# Patient Record
Sex: Female | Born: 1960 | Race: Black or African American | Hispanic: No | Marital: Married | Smoking: Never smoker
Health system: Southern US, Community
[De-identification: ages and names within clinical notes are randomized; demographics above are authoritative.]

## PROBLEM LIST (undated history)

## (undated) DIAGNOSIS — Z789 Other specified health status: Secondary | ICD-10-CM

## (undated) DIAGNOSIS — H409 Unspecified glaucoma: Secondary | ICD-10-CM

## (undated) HISTORY — DX: Unspecified glaucoma: H40.9

## (undated) HISTORY — PX: HERNIA REPAIR: SHX51

---

## 1999-08-26 ENCOUNTER — Other Ambulatory Visit: Admission: RE | Admit: 1999-08-26 | Discharge: 1999-08-26 | Payer: Self-pay | Admitting: *Deleted

## 2000-11-17 ENCOUNTER — Other Ambulatory Visit: Admission: RE | Admit: 2000-11-17 | Discharge: 2000-11-17 | Payer: Self-pay | Admitting: *Deleted

## 2002-03-27 ENCOUNTER — Other Ambulatory Visit: Admission: RE | Admit: 2002-03-27 | Discharge: 2002-03-27 | Payer: Self-pay | Admitting: *Deleted

## 2002-09-29 ENCOUNTER — Emergency Department (HOSPITAL_COMMUNITY): Admission: EM | Admit: 2002-09-29 | Discharge: 2002-09-29 | Payer: Self-pay

## 2002-09-30 ENCOUNTER — Encounter: Payer: Self-pay | Admitting: Emergency Medicine

## 2004-01-03 ENCOUNTER — Other Ambulatory Visit: Admission: RE | Admit: 2004-01-03 | Discharge: 2004-01-03 | Payer: Self-pay | Admitting: *Deleted

## 2005-02-11 ENCOUNTER — Other Ambulatory Visit: Admission: RE | Admit: 2005-02-11 | Discharge: 2005-02-11 | Payer: Self-pay | Admitting: Obstetrics and Gynecology

## 2005-12-07 ENCOUNTER — Emergency Department (HOSPITAL_COMMUNITY): Admission: EM | Admit: 2005-12-07 | Discharge: 2005-12-07 | Payer: Self-pay | Admitting: Emergency Medicine

## 2009-03-08 ENCOUNTER — Emergency Department (HOSPITAL_COMMUNITY): Admission: EM | Admit: 2009-03-08 | Discharge: 2009-03-08 | Payer: Self-pay | Admitting: Emergency Medicine

## 2011-04-27 ENCOUNTER — Other Ambulatory Visit: Payer: Self-pay | Admitting: Family Medicine

## 2011-04-27 DIAGNOSIS — L723 Sebaceous cyst: Secondary | ICD-10-CM

## 2011-04-28 ENCOUNTER — Ambulatory Visit
Admission: RE | Admit: 2011-04-28 | Discharge: 2011-04-28 | Disposition: A | Discharge status: Home / Self Care | Payer: BC Managed Care – PPO | Source: Ambulatory Visit | Attending: Family Medicine | Admitting: Family Medicine

## 2011-04-28 DIAGNOSIS — L723 Sebaceous cyst: Secondary | ICD-10-CM

## 2012-03-08 ENCOUNTER — Encounter: Payer: Self-pay | Admitting: Internal Medicine

## 2012-03-25 ENCOUNTER — Telehealth: Payer: Self-pay | Admitting: *Deleted

## 2012-03-25 NOTE — Telephone Encounter (Signed)
No show for previsit.  Unable to reach at home number.  Sent no show letter 

## 2012-04-08 ENCOUNTER — Encounter: Payer: BC Managed Care – PPO | Admitting: Internal Medicine

## 2017-09-12 ENCOUNTER — Encounter (HOSPITAL_COMMUNITY): Payer: Self-pay | Admitting: *Deleted

## 2017-09-12 ENCOUNTER — Ambulatory Visit (HOSPITAL_COMMUNITY)
Admission: EM | Admit: 2017-09-12 | Discharge: 2017-09-12 | Disposition: A | Discharge status: Home / Self Care | Payer: 59 | Attending: Family Medicine | Admitting: Family Medicine

## 2017-09-12 DIAGNOSIS — M5432 Sciatica, left side: Secondary | ICD-10-CM | POA: Diagnosis not present

## 2017-09-12 MED ORDER — CYCLOBENZAPRINE HCL 5 MG PO TABS: 5.0000 mg | ORAL_TABLET | Freq: Every day | ORAL | 0 refills | Status: DC

## 2017-09-12 MED ORDER — PREDNISONE 20 MG PO TABS: ORAL_TABLET | ORAL | 0 refills | Status: DC

## 2017-09-12 NOTE — ED Triage Notes (Signed)
Denies injury.  C/O low back pain radiating down LLE with intermittent numbness/tingling.  Went to another urgent care 4 days ago - was placed on methylprednisolone.  States pain actually getting worse.  Denies any incontinence.

## 2017-09-12 NOTE — ED Provider Notes (Signed)
[facility]   [id number] 09/12/17 Arrival Time: 1212   SUBJECTIVE:  Kristin Nguyen is a 56 y.o. female who presents to the urgent care with complaint of  low back pain radiating down LLE with intermittent numbness/tingling.  Went to another urgent care 4 days ago - was placed on methylprednisolone.  Pain had dramatically improved, but worsened yesterday after doing a great deal of housework.   Denies any incontinence.     History reviewed. No pertinent past medical history. No family history on file. Social History   Socioeconomic History  . Marital status: Married    Spouse name: Not on file  . Number of children: Not on file  . Years of education: Not on file  . Highest education level: Not on file  Social Needs  . Financial resource strain: Not on file  . Food insecurity - worry: Not on file  . Food insecurity - inability: Not on file  . Transportation needs - medical: Not on file  . Transportation needs - non-medical: Not on file  Occupational History  . Not on file  Tobacco Use  . Smoking status: Never Smoker  Substance and Sexual Activity  . Alcohol use: No    Frequency: Never  . Drug use: No  . Sexual activity: Not on file  Other Topics Concern  . Not on file  Social History Narrative  . Not on file   Current Meds  Medication Sig  . Misc Natural Products (SM GLUCOSAMINE CHONDROITIN PO) Take by mouth.  . Multiple Vitamin (MULTIVITAMIN) capsule Take 1 capsule daily by mouth.  . Omega-3 Fatty Acids (FISH OIL PO) Take by mouth.  . [DISCONTINUED] METHYLPREDNISOLONE PO Take by mouth.   No Known Allergies    ROS: As per HPI, remainder of ROS negative.   OBJECTIVE:   Vitals:   09/12/17 1311  BP: 129/79  Pulse: 68  Resp: 16  Temp: 97.6 F (36.4 C)  TempSrc: Oral  SpO2: 98%     General appearance: alert; no distress Eyes: PERRL; EOMI; conjunctiva normal HENT: normocephalic; atraumatic;  external ears normal without trauma; nasal  mucosa normal; oral mucosa normal Neck: supple Abdomen: soft, non-tender; bowel sounds normal; no masses or organomegaly; no guarding or rebound tenderness Back: no CVA tenderness Extremities: no cyanosis or edema; symmetrical with no gross deformities;   SLR positive on left at 20 degrees. Skin: warm and dry Neurologic: normal gait; grossly normal Psychological: alert and cooperative; normal mood and affect      Labs:  No results found for this or any previous visit.  Labs Reviewed - No data to display  No results found.     ASSESSMENT & PLAN:  1. Sciatica of left side     Meds ordered this encounter  Medications  . DISCONTD: METHYLPREDNISOLONE PO    Sig: Take by mouth.  . Omega-3 Fatty Acids (FISH OIL PO)    Sig: Take by mouth.  . Multiple Vitamin (MULTIVITAMIN) capsule    Sig: Take 1 capsule daily by mouth.  . Misc Natural Products (SM GLUCOSAMINE CHONDROITIN PO)    Sig: Take by mouth.  . predniSONE (DELTASONE) 20 MG tablet    Sig: Two daily with food    Dispense:  10 tablet    Refill:  0  . cyclobenzaprine (FLEXERIL) 5 MG tablet    Sig: Take 1 tablet (5 mg total) at bedtime by mouth.    Dispense:  7 tablet    Refill:  0  Reviewed expectations re: course of current medical issues. Questions answered. Outlined signs and symptoms indicating need for more acute intervention. Patient verbalized understanding. After Visit Summary given.    Procedures:      Elvina SidleLauenstein, Elizet Kaplan, MD 09/12/17 1339

## 2017-09-14 ENCOUNTER — Ambulatory Visit (INDEPENDENT_AMBULATORY_CARE_PROVIDER_SITE_OTHER): Payer: 59

## 2017-09-14 ENCOUNTER — Encounter (INDEPENDENT_AMBULATORY_CARE_PROVIDER_SITE_OTHER): Payer: Self-pay | Admitting: Orthopedic Surgery

## 2017-09-14 ENCOUNTER — Ambulatory Visit (INDEPENDENT_AMBULATORY_CARE_PROVIDER_SITE_OTHER): Payer: 59 | Admitting: Orthopedic Surgery

## 2017-09-14 DIAGNOSIS — M5442 Lumbago with sciatica, left side: Secondary | ICD-10-CM | POA: Diagnosis not present

## 2017-09-14 MED ORDER — PREDNISONE 10 MG PO TABS: 20.0000 mg | ORAL_TABLET | Freq: Every day | ORAL | 0 refills | Status: DC

## 2017-09-14 NOTE — Progress Notes (Signed)
Office Visit Note   Patient: Kristin BowelsColleen A Bashor           Date of Birth: 10/21/1961           MRN: [id number] Visit Date: 09/14/2017              Requested by: No referring provider defined for this encounter. PCP: Darrin Nipperollege, [facility] [place]  Chief Complaint  Patient presents with  . Lower Back - Pain      HPI: Patient is a 56 year old woman who has had several month history of lower back pain.  States she had pain about 3 months ago has had no injuries and has had recurrent pain in her lumbar spine with pain radiating to the left buttocks and the lateral aspect of the left calf.  She states it is difficult to sleep she has increased pain after prolonged sleeping.  She started a methyl prednisolone Dosepak of 1 week ago and 2 days ago started a prescription for prednisone 20 mg twice daily as well as Flexeril with minimal relief.  Patient states that she has severe claustrophobia and does not feel like she could tolerate an MRI scan is not in any way.  Assessment & Plan: Visit Diagnoses:  1. Acute left-sided low back pain with left-sided sciatica     Plan: We will have her continue the prednisone recommended taking only 20 mg every morning she can take the Flexeril at night we will call in a new prescription for the prednisone and she is to use a ThermaCare heating pad on her back.  Recommended getting a standing desk avoid sitting.  She will call if she wants to schedule an MRI scan.  Patient states she has severe claustrophobia and does not feel like she could tolerate an MRI scan even with Valium.  Follow-Up Instructions: Return if symptoms worsen or fail to improve.   Ortho Exam  Patient is alert, oriented, no adenopathy, well-dressed, normal affect, normal respiratory effort. Gait with a flexed posture secondary to her muscle spasm.  Patient has no pain with range of motion of the hip knee or ankle.  She has a negative straight leg raise on the left.  She has good  motor strength in all motor groups with the EHL plantar flexion dorsiflexion of the ankle knee flexion extension and hip flexion.  Imaging: Xr Lumbar Spine 2-3 Views  Result Date: 09/14/2017 2 view radiographs of the lumbar spine shows some mild bony spurs there is no significant joint space collapse no spondylolisthesis.  Possible spondylolysis at L5.  No images are attached to the encounter.  Labs: No results found for: HGBA1C, ESRSEDRATE, CRP, LABURIC, REPTSTATUS, GRAMSTAIN, CULT, LABORGA  Orders:  Orders Placed This Encounter  Procedures  . XR Lumbar Spine 2-3 Views   No orders of the defined types were placed in this encounter.    Procedures: No procedures performed  Clinical Data: No additional findings.  ROS:  All other systems negative, except as noted in the HPI. Review of Systems  Objective: Vital Signs: There were no vitals taken for this visit.  Specialty Comments:  No specialty comments available.  PMFS History: There are no active problems to display for this patient.  History reviewed. No pertinent past medical history.  History reviewed. No pertinent family history.  History reviewed. No pertinent surgical history. Social History   Occupational History  . Not on file  Tobacco Use  . Smoking status: Never Smoker  . Smokeless tobacco:  Never Used  Substance and Sexual Activity  . Alcohol use: No    Frequency: Never  . Drug use: No  . Sexual activity: Not on file

## 2017-09-28 ENCOUNTER — Other Ambulatory Visit: Payer: Self-pay | Admitting: Neurological Surgery

## 2017-09-28 DIAGNOSIS — M5416 Radiculopathy, lumbar region: Secondary | ICD-10-CM

## 2017-09-30 ENCOUNTER — Other Ambulatory Visit (HOSPITAL_COMMUNITY): Payer: Self-pay | Admitting: Neurological Surgery

## 2017-09-30 DIAGNOSIS — M5416 Radiculopathy, lumbar region: Secondary | ICD-10-CM

## 2017-10-11 ENCOUNTER — Ambulatory Visit (HOSPITAL_COMMUNITY)
Admission: RE | Admit: 2017-10-11 | Discharge: 2017-10-11 | Disposition: A | Discharge status: Home / Self Care | Payer: 59 | Source: Ambulatory Visit | Attending: Neurological Surgery | Admitting: Neurological Surgery

## 2017-10-15 NOTE — Pre-Procedure Instructions (Signed)
    Julius BowelsColleen A Accardo  10/15/2017      Your procedure is scheduled on Thursday, January 3.  [facility] at 8:15 AM                  Your surgery or procedure is scheduled for 10:15 AM   Call this number if you have problems the morning of surgery: [phone]    Remember:  Do not eat food or drink liquids after midnight Wednesday, January 2.  Take these medicines the morning of surgery with A SIP OF WATER: diclofenac (VOLTAREN) Glucosamine-Chondroitin (COSAMIN DS PO) Omega-3 Fatty Acids (FISH OIL PO) Eye Drops   Do not wear jewelry, make-up or nail polish.  Do not wear lotions, powders, or perfumes, or deodorant.  Do not shave 48 hours prior to surgery.  Men may shave face and neck.  Do not bring valuables to the hospital.  [facility] is not responsible for any belongings or valuables.  Contacts, dentures or bridgework may not be worn into surgery.  Leave your suitcase in the car.  After surgery it may be brought to your room.  For patients admitted to the hospital, discharge time will be determined by your treatment team.  Patients discharged the day of surgery will not be allowed to drive home.    Please read over the following fact sheets that you were given: Coughing and Deep Breathing

## 2017-10-18 ENCOUNTER — Other Ambulatory Visit: Payer: Self-pay

## 2017-10-18 ENCOUNTER — Encounter (HOSPITAL_COMMUNITY): Payer: Self-pay

## 2017-10-18 ENCOUNTER — Inpatient Hospital Stay (HOSPITAL_COMMUNITY)
Admission: RE | Admit: 2017-10-18 | Discharge: 2017-10-18 | Disposition: A | Discharge status: Home / Self Care | Payer: 59 | Source: Ambulatory Visit

## 2017-10-18 HISTORY — DX: Other specified health status: Z78.9

## 2017-10-21 NOTE — Progress Notes (Addendum)
I called and left a voice message on Jessica's voice mail, I requested H/P.  Shanda BumpsJessica called and reports that Dr Bevely Palmeritty will be back in the office tomorrow and she will have him completer H/P and fax it to us.

## 2017-10-28 ENCOUNTER — Encounter (HOSPITAL_COMMUNITY): Payer: Self-pay | Admitting: *Deleted

## 2017-10-28 ENCOUNTER — Ambulatory Visit (HOSPITAL_COMMUNITY)
Admission: RE | Admit: 2017-10-28 | Discharge: 2017-10-28 | Disposition: A | Discharge status: Home / Self Care | Payer: 59 | Source: Ambulatory Visit | Attending: Neurological Surgery | Admitting: Neurological Surgery

## 2017-10-28 ENCOUNTER — Encounter (HOSPITAL_COMMUNITY): Payer: Self-pay

## 2017-10-28 ENCOUNTER — Other Ambulatory Visit (HOSPITAL_COMMUNITY): Payer: 59

## 2017-10-28 ENCOUNTER — Ambulatory Visit (HOSPITAL_COMMUNITY): Payer: 59 | Admitting: Certified Registered"

## 2017-10-28 ENCOUNTER — Encounter (HOSPITAL_COMMUNITY): Admission: RE | Disposition: A | Discharge status: Home / Self Care | Payer: Self-pay | Source: Ambulatory Visit

## 2017-10-28 DIAGNOSIS — M5116 Intervertebral disc disorders with radiculopathy, lumbar region: Secondary | ICD-10-CM | POA: Diagnosis not present

## 2017-10-28 DIAGNOSIS — M5416 Radiculopathy, lumbar region: Secondary | ICD-10-CM | POA: Diagnosis present

## 2017-10-28 HISTORY — PX: RADIOLOGY WITH ANESTHESIA: SHX6223

## 2017-10-28 SURGERY — MRI WITH ANESTHESIA
Anesthesia: General

## 2017-10-28 MED ORDER — MIDAZOLAM HCL 5 MG/5ML IJ SOLN
INTRAMUSCULAR | Status: DC | PRN
  Administered 2017-10-28 (×2): 2 mg via INTRAVENOUS

## 2017-10-28 MED ORDER — SODIUM CHLORIDE 0.9 % IV SOLN
INTRAVENOUS | Status: DC | PRN
  Administered 2017-10-28: 11:00:00 via INTRAVENOUS

## 2017-10-28 MED ORDER — DEXAMETHASONE SODIUM PHOSPHATE 4 MG/ML IJ SOLN
INTRAMUSCULAR | Status: DC | PRN
  Administered 2017-10-28: 8 mg via INTRAVENOUS

## 2017-10-28 MED ORDER — PROPOFOL 10 MG/ML IV BOLUS
INTRAVENOUS | Status: DC | PRN
  Administered 2017-10-28 (×2): 50 mg via INTRAVENOUS
  Administered 2017-10-28: 200 mg via INTRAVENOUS

## 2017-10-28 MED ORDER — LIDOCAINE 2% (20 MG/ML) 5 ML SYRINGE
INTRAMUSCULAR | Status: DC | PRN
  Administered 2017-10-28: 80 mg via INTRAVENOUS

## 2017-10-28 MED ORDER — FENTANYL CITRATE (PF) 100 MCG/2ML IJ SOLN
INTRAMUSCULAR | Status: DC | PRN
  Administered 2017-10-28 (×2): 50 ug via INTRAVENOUS

## 2017-10-28 MED ORDER — ONDANSETRON HCL 4 MG/2ML IJ SOLN
INTRAMUSCULAR | Status: DC | PRN
  Administered 2017-10-28: 4 mg via INTRAVENOUS

## 2017-10-28 MED ORDER — LACTATED RINGERS IV SOLN
INTRAVENOUS | Status: DC | PRN
  Administered 2017-10-28: 10:00:00 via INTRAVENOUS

## 2017-10-28 MED ORDER — LACTATED RINGERS IV SOLN
INTRAVENOUS | Status: DC
  Administered 2017-10-28: 08:00:00 via INTRAVENOUS

## 2017-10-28 MED ORDER — GLYCOPYRROLATE 0.2 MG/ML IJ SOLN
INTRAMUSCULAR | Status: DC | PRN
  Administered 2017-10-28: 0.2 mg via INTRAVENOUS

## 2017-10-28 NOTE — Anesthesia Preprocedure Evaluation (Signed)
Anesthesia Evaluation  Patient identified by MRN, date of birth, ID band Patient awake    Reviewed: Allergy & Precautions, NPO status , Patient's Chart, lab work & pertinent test results  Airway Mallampati: II  TM Distance: >3 FB Neck ROM: Full    Dental no notable dental hx.    Pulmonary neg pulmonary ROS,    Pulmonary exam normal breath sounds clear to auscultation       Cardiovascular negative cardio ROS Normal cardiovascular exam Rhythm:Regular Rate:Normal     Neuro/Psych negative neurological ROS  negative psych ROS   GI/Hepatic negative GI ROS, Neg liver ROS,   Endo/Other  negative endocrine ROS  Renal/GU negative Renal ROS     Musculoskeletal negative musculoskeletal ROS (+)   Abdominal   Peds  Hematology negative hematology ROS (+)   Anesthesia Other Findings   Reproductive/Obstetrics negative OB ROS                             Anesthesia Physical Anesthesia Plan  ASA: II  Anesthesia Plan: General   Post-op Pain Management:    Induction: Intravenous  PONV Risk Score and Plan: 3 and Ondansetron, Dexamethasone and Midazolam  Airway Management Planned: LMA  Additional Equipment:   Intra-op Plan:   Post-operative Plan: Extubation in OR  Informed Consent: I have reviewed the patients History and Physical, chart, labs and discussed the procedure including the risks, benefits and alternatives for the proposed anesthesia with the patient or authorized representative who has indicated his/her understanding and acceptance.   Dental advisory given  Plan Discussed with: CRNA  Anesthesia Plan Comments:         Anesthesia Quick Evaluation  

## 2017-10-28 NOTE — Transfer of Care (Signed)
Immediate Anesthesia Transfer of Care Note  Patient: Kristin Nguyen  Procedure(s) Performed: MRI WITH ANESTHESIA OF LUMBAR SPINE WITHOUT CONTRAST (N/A )  Patient Location: PACU  Anesthesia Type:General  Level of Consciousness: awake, oriented and patient cooperative  Airway & Oxygen Therapy: Patient Spontanous Breathing and Patient connected to face mask oxygen  Post-op Assessment: Report given to RN and Post -op Vital signs reviewed and stable  Post vital signs: Reviewed and stable  Last Vitals:  Vitals:   10/28/17 0806 10/28/17 1134  BP: 134/75 125/83  Pulse: 81 91  Resp: 20   Temp: 36.8 C 36.4 C  SpO2: 100%     Last Pain:  Vitals:   10/28/17 1134  PainSc: 0-No pain         Complications: No apparent anesthesia complications

## 2017-10-29 ENCOUNTER — Encounter (HOSPITAL_COMMUNITY): Payer: Self-pay | Admitting: Radiology

## 2017-10-29 NOTE — Progress Notes (Signed)
H&P Updated No major changes in history. Proceed with MRI. Kristin Nguyen 

## 2017-10-29 NOTE — Addendum Note (Signed)
Addendum  created 10/29/17 1054 by Lewie LoronGermeroth, Liandro Thelin, MD   Sign clinical note

## 2018-08-10 IMAGING — MR MR LUMBAR SPINE W/O CM
4 of 5 series · 19 of 48 positions shown · non-contrast
Comparison: Lumbar radiographs 09/14/2017

CLINICAL DATA: Lumbar radiculopathy.  Left leg pain

EXAM:
MRI LUMBAR SPINE WITHOUT CONTRAST
TECHNIQUE: Multiplanar, multisequence MR imaging of the lumbar spine was
performed. No intravenous contrast was administered.

[Series 2: T2 · sagittal · 4.0mm · 0.55mm/px · 4 of 12 slices shown (1 of 2)]
[im 1/12]
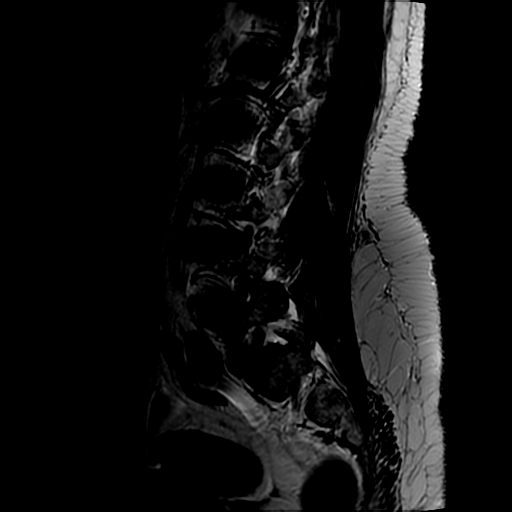
[im 4/12]
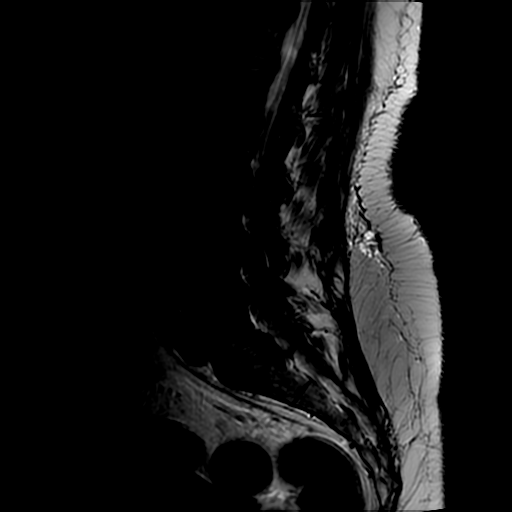
[im 8/12]
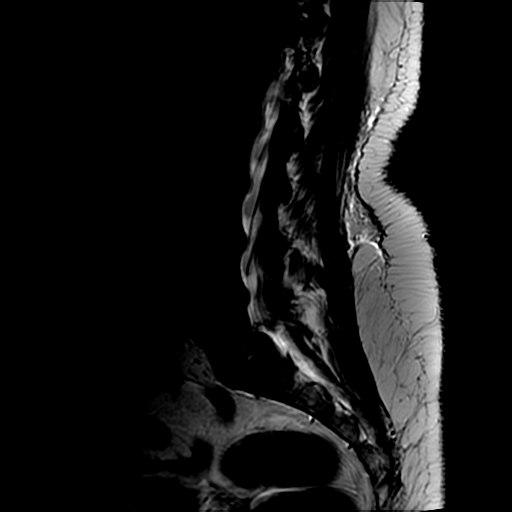
[im 12/12]
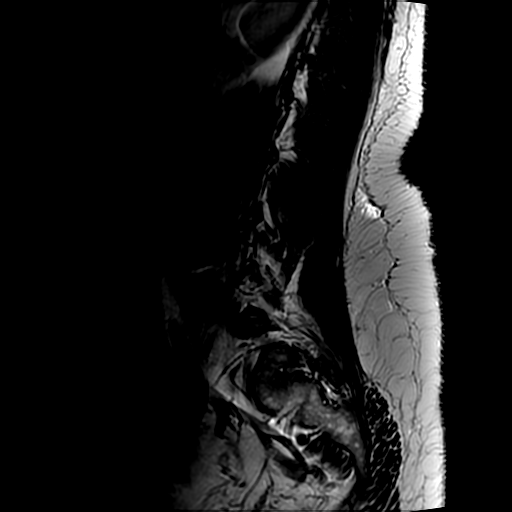

[Series 5: T1 · sagittal · 4.0mm · 0.55mm/px · 3 of 12 slices shown (1 of 2)]
[im 1/12]
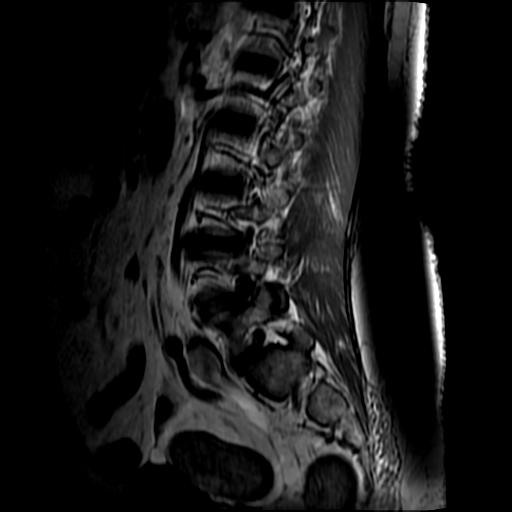
[im 6/12]
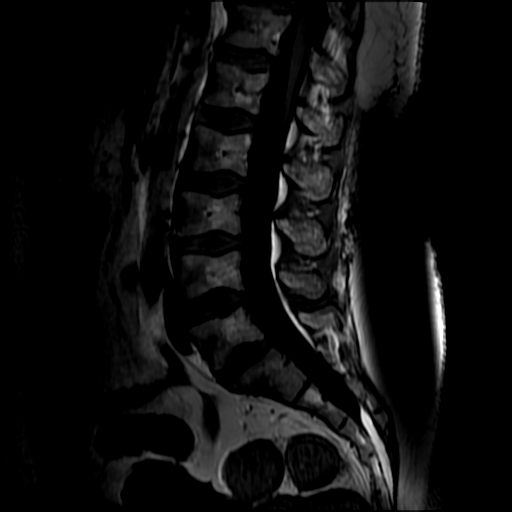
[im 12/12]
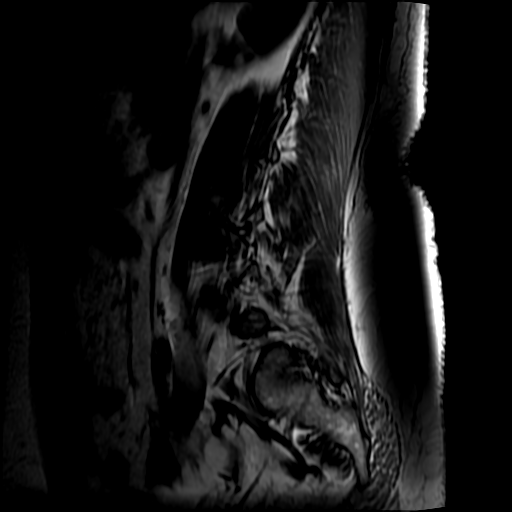

[Series 6: T2 · axial · 4.0mm · 0.39mm/px · z∈[-15,+145]mm · 9 of 40 slices shown (2 of 2)]
[im 3/40]
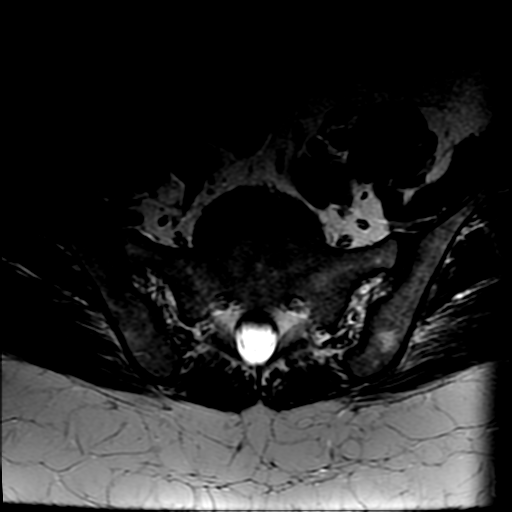
[im 5/40]
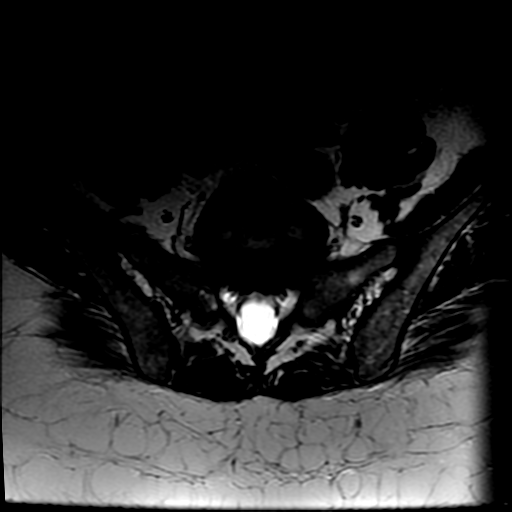
[im 8/40]
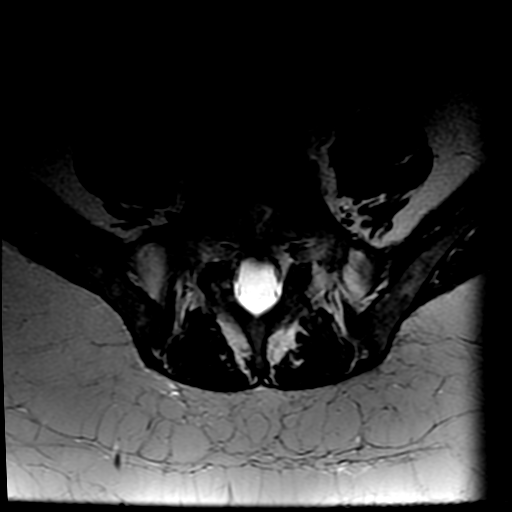
[im 13/40]
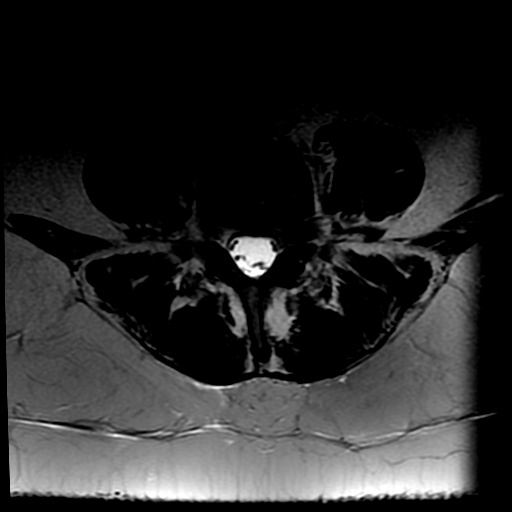
[im 18/40]
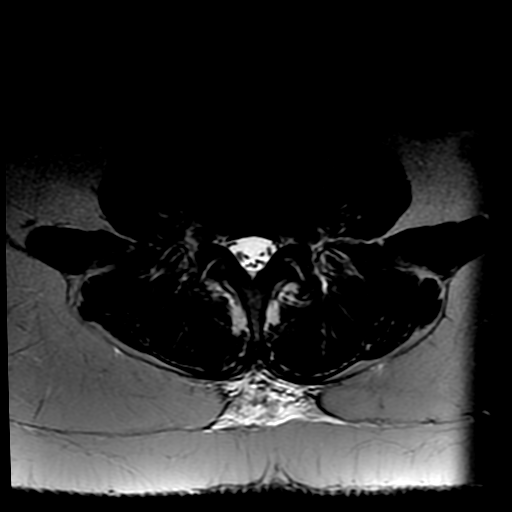
[im 20/40]
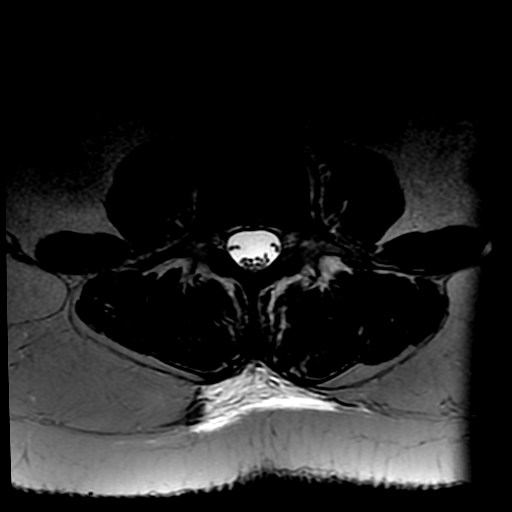
[im 22/40]
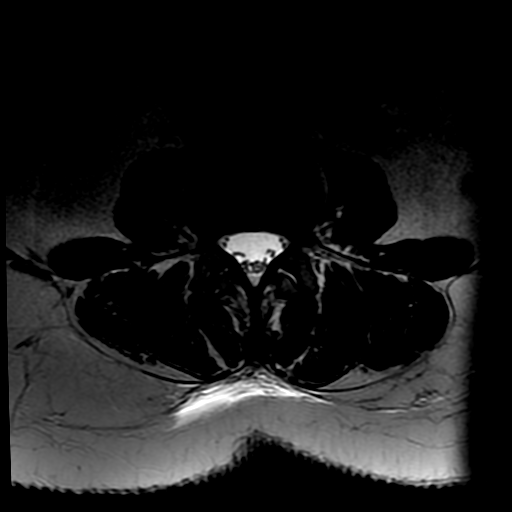
[im 27/40]
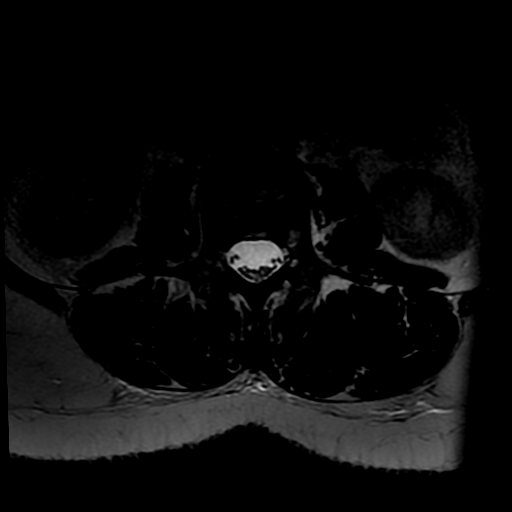
[im 35/40]
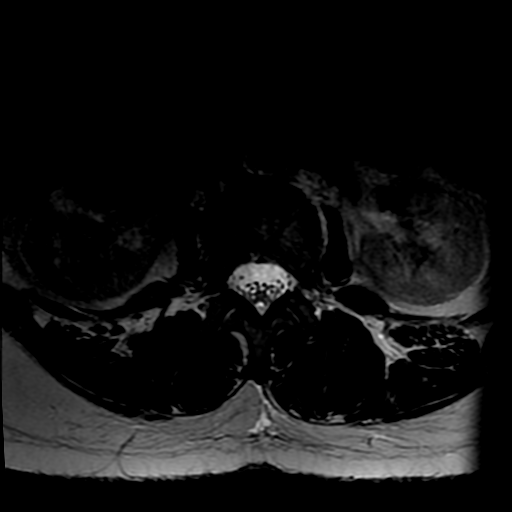

[Series 7: T1 · axial · 4.0mm · 0.39mm/px · z∈[-5,+145]mm · 3 of 40 slices shown (2 of 2)]
[im 5/40]
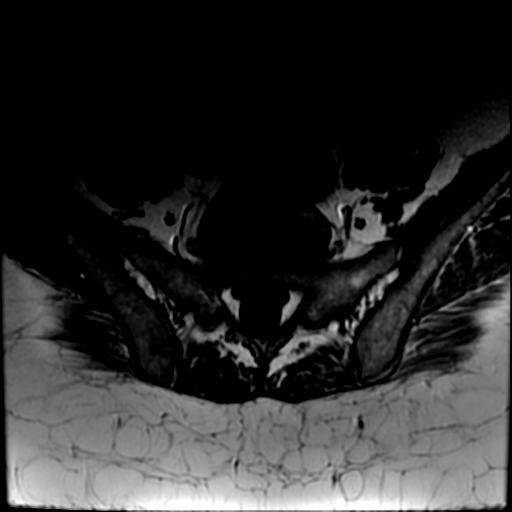
[im 20/40]
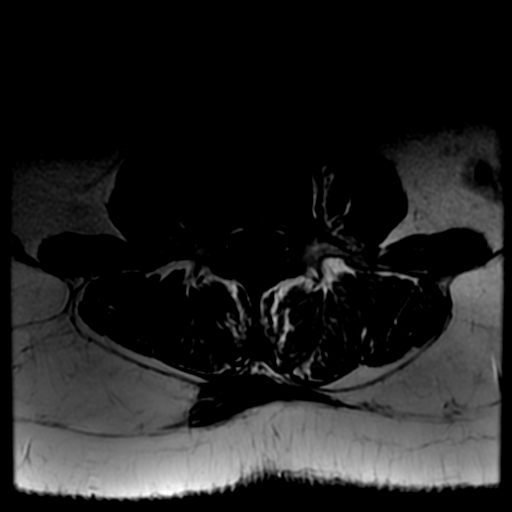
[im 35/40]
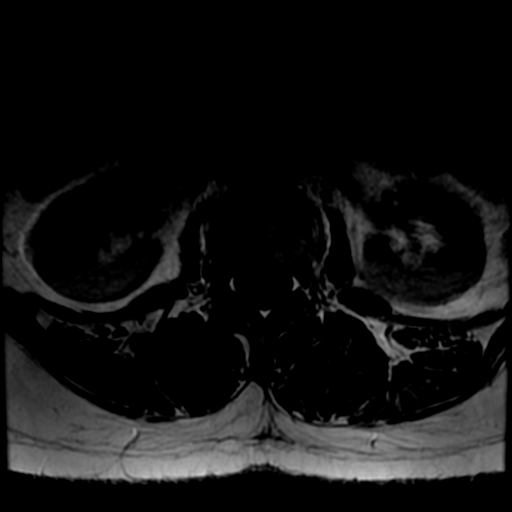

[19 of 48 positions shown; findings below may reference images not displayed]

FINDINGS: Segmentation: Study was done under general anesthesia for
claustrophobia. A good quality study was obtained.

Normal segmentation.  Lowest disc space L5-S1

Alignment:  Normal

Vertebrae:  Negative for fracture or mass

Conus medullaris and cauda equina: Conus extends to the L1-2 level.
Conus and cauda equina appear normal.

Paraspinal and other soft tissues: Negative for mass or adenopathy

Disc levels:

L1-2:  Negative

L2-3:  Mild disc bulging without spinal stenosis

L3-4:  Mild disc bulging without spinal stenosis.

L4-5: Left foraminal disc protrusion with compression of the left L4
nerve root in the foramen. Spinal canal normal in size. Mild facet
degeneration.

L5-S1:  Negative
IMPRESSION: Left foraminal disc protrusion L4-5 with impingement of left L4
nerve root in the foramen.

## 2021-06-02 ENCOUNTER — Telehealth: Payer: Self-pay

## 2021-06-02 NOTE — Telephone Encounter (Signed)
Mrs. Verda Cumins and her husband would like to become Dr. Brayton Layman patient. She is being referred by Jaclyn Shaggy her mother in law. Please advise

## 2021-06-04 NOTE — Telephone Encounter (Signed)
Called pt back and informed her Dr. Dallas Schimke is currently at her max amount of patients. Offered [place] and [place] but she stated she would want a doctor so she will call the other offices.

## 2022-03-17 LAB — HM COLONOSCOPY

## 2022-09-23 LAB — HM DEXA SCAN

## 2023-08-19 ENCOUNTER — Ambulatory Visit: Payer: 59 | Admitting: Orthopedic Surgery

## 2023-09-06 ENCOUNTER — Ambulatory Visit: Payer: 59 | Admitting: Orthopedic Surgery

## 2024-01-12 LAB — RESULTS CONSOLE HPV: CHL HPV: NEGATIVE

## 2024-01-12 LAB — HM MAMMOGRAPHY

## 2024-01-12 LAB — HM PAP SMEAR

## 2024-01-13 ENCOUNTER — Telehealth: Payer: Self-pay | Admitting: Family Medicine

## 2024-01-13 NOTE — Telephone Encounter (Signed)
 Copied from CRM [id number]. Topic: General - Other >> Jan 13, 2024  2:44 PM Almira Coaster wrote: Reason for CRM: Patient is calling to schedule a new patient appointment with Dr.Copland, her mother Lavella Hammock a patient of Dr.Copland asked and Dr.Copland approved to add them on as a new patient. Patient's best call back number is [phone].

## 2024-01-13 NOTE — Telephone Encounter (Signed)
 Okay to schedule

## 2024-02-12 NOTE — Progress Notes (Unsigned)
 [facility] [address], [address] [place], [place] [address] [phone]  Date:  02/16/2024   Name:  Kristin Nguyen   DOB:  1961/08/27   MRN:  [id number]  PCP:  Kaylee Partridge, MD    Chief Complaint: New Patient (Initial Visit) (Concerns/ questions: 1. congestion x 1 week. Tried Zicam and Mucinex. 2. L ankle. 3. Lumps on the arm. [place] PCP: Dr Daivd Dub- [facility]/Due: td- pt thinks this is utd)   History of Present Illness:  Kristin Nguyen is a 63 y.o. very pleasant female patient who presents with the following:  Seen today as a new patient to establish care, I have taken care of her mother for many years She would like to have her husband see us  as well which is fine  Colonoscopy is up-to-date; completed about 1 year ago, we will try to get this report Mammogram- PFW Pap smear- PFW Shingrix is complete She also reports having a DEXA scan done, I will request this report Tetanus- this is likely due but she declines to update today  She has lived in [place] since the 1990s She works in HR She has 2 adult kids and 3 grands, married for 30+ years Son lives with  her, daughter and 3 grandchildren in in [place]  Her father passed away about 3 years ago  In her free time she enjoys exercise -strengthen cardio training  She notes onset of ST maybe a week ago This got better but she has congestion in her sinuses She does not generally have sinus issues or seasonal allergies Occasional sneezing Occasional cough No fever Her sinsues do not hurt  She is using muciex and zicam  She notes some pain in her right ankle for about 3 months, the medial aspect of the ankle.  She is not aware of any particular injury.  She is able to do her normal activities but it is a nagging pain  She also notes a couple of "bumps" on her right arm that been present for a year or so  There are no active problems to display for  this patient.   Past Medical History:  Diagnosis Date   Glaucoma    Medical history non-contributory     Past Surgical History:  Procedure Laterality Date   HERNIA REPAIR Bilateral    Inguinal   RADIOLOGY WITH ANESTHESIA N/A 10/28/2017   Procedure: MRI WITH ANESTHESIA OF LUMBAR SPINE WITHOUT CONTRAST;  Surgeon: Radiologist, Medication, MD;  Location: [facility] OR;  Service: Radiology;  Laterality: N/A;    Social History   Tobacco Use   Smoking status: Never   Smokeless tobacco: Never  Vaping Use   Vaping status: Never Used  Substance Use Topics   Alcohol use: No   Drug use: No    Family History  Problem Relation Age of Onset   Aneurysm Mother        Cerbral- " mixiture of diet pills and birth control pills   Hypertension Father    Diabetes Father    Prostate cancer Father     No Known Allergies  Medication list has been reviewed and updated.  Current Outpatient Medications on File Prior to Visit  Medication Sig Dispense Refill   acetaminophen (TYLENOL) 500 MG tablet Take 1,000 mg by mouth 2 (two) times daily.     Ascorbic Acid (VITAMIN C PO) Take by mouth.     cyanocobalamin (  VITAMIN B12) 500 MCG tablet Take 2 tablets by mouth daily.     Glucosamine-Chondroitin (COSAMIN DS PO) Take 2 tablets by mouth daily.     Multiple Vitamin (MULTIVITAMIN WITH MINERALS) TABS tablet Take 1 tablet by mouth daily.     naphazoline-glycerin (CLEAR EYES REDNESS) 0.012-0.2 % SOLN Place 1-2 drops into both eyes 4 (four) times daily as needed for eye irritation (for eye redness.).     Omega-3 Fatty Acids (FISH OIL PO) Take 2 capsules by mouth daily.     calcium carbonate (OS-CAL - DOSED IN MG OF ELEMENTAL CALCIUM) 1250 (500 Ca) MG tablet Take 1,250 mg by mouth.     dorzolamide-timolol (COSOPT) 2-0.5 % ophthalmic solution dorzolamide 22.3 mg-timolol 6.8 mg/mL eye drops  INSTILL 1 DROP INTO BOTH EYE TWICE DAILY AS DIRECTED     No current facility-administered medications on file prior to visit.     Review of Systems:  As per HPI- otherwise negative.   Physical Examination: Vitals:   02/16/24 1119  BP: 122/80  Pulse: 77  Resp: 18  Temp: 98.2 F (36.8 C)  SpO2: 95%   Vitals:   02/16/24 1119  Weight: 250 lb (113.4 kg)  Height: 5\' 10"  (1.778 m)   Body mass index is 35.87 kg/m. Ideal Body Weight: Weight in (lb) to have BMI = 25: 173.9  GEN: no acute distress.  Mildly obese, muscular build.  Looks well HEENT: Atraumatic, Normocephalic.  Bilateral TM wnl, oropharynx normal.  PEERL,EOMI. there is some inflammation and congestion of the nasal passages Ears and Nose: No external deformity. CV: RRR, No M/G/R. No JVD. No thrill. No extra heart sounds. PULM: CTA B, no wheezes, crackles, rhonchi. No retractions. No resp. distress. No accessory muscle use. ABD: S, NT, ND, +BS. No rebound. No HSM. EXTR: No c/c/e PSYCH: Normally interactive. Conversant.  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  They are nontender and mobile.  I offered to get an ultrasound to look at these in further detail, she declines at this time There is tenderness of the right ankle just inferior to the medial malleolus-suspect ligamentous discomfort   Assessment and Plan: Screening for diabetes mellitus - Plan: Comprehensive metabolic panel with GFR, Hemoglobin A1c  Screening, lipid - Plan: Lipid panel  Thyroid  disorder screening - Plan: TSH  Screening for deficiency anemia - Plan: CBC  Sinus congestion - Plan: amoxicillin  (AMOXIL ) 875 MG tablet  Chronic pain of left ankle  Chronic pain of right ankle - Plan: Ambulatory referral to Sports Medicine  Patient seen today to establish care and discuss a couple of concerns.  Routine blood work is pending as above.  Reminded that  Suspect her sinus symptoms are due to either allergies or a viral infection  We will get you referred to sports medicine for your ankle pain  I suspect your upper respiratory symptoms are likely  due to either a viral infection or allergies.  I would recommend trying a few days of a nasal steroid spray like Flonase or Nasacort and over-the-counter nonsedating antihistamine like Claritin.  However if this is not helping in the next few days you can certainly start the prescription for amoxicillin   I suspect you may be due for a tetanus booster, not an emergency but certainly if you have a dirty wound of any type get a booster.  Otherwise we can do this next time we see you  [facility] from [facility].  See if you can think of who did your colonoscopy for you   Signed Gates Kasal, MD  Received labs as below, message to patient  Results for orders placed or performed in visit on 02/16/24  CBC   Collection Time: 02/16/24 11:37 AM  Result Value Ref Range   WBC 6.4 4.0 - 10.5 K/uL   RBC 4.87 3.87 - 5.11 Mil/uL   Platelets 281.0 150.0 - 400.0 K/uL   Hemoglobin 14.1 12.0 - 15.0 g/dL   HCT 04.5 40.9 - 81.1 %   MCV 86.9 78.0 - 100.0 fl   MCHC 33.2 30.0 - 36.0 g/dL   RDW 91.4 78.2 - 95.6 %  Comprehensive metabolic panel with GFR   Collection Time: 02/16/24 11:37 AM  Result Value Ref Range   Sodium 140 135 - 145 mEq/L   Potassium 3.9 3.5 - 5.1 mEq/L   Chloride 102 96 - 112 mEq/L   CO2 32 19 - 32 mEq/L   Glucose, Bld 128 (H) 70 - 99 mg/dL   BUN 10 6 - 23 mg/dL   Creatinine, Ser 2.13 0.40 - 1.20 mg/dL   Total Bilirubin 0.4 0.2 - 1.2 mg/dL   Alkaline Phosphatase 69 39 - 117 U/L   AST 18 0 - 37 U/L   ALT 32 0 - 35 U/L   Total Protein 7.4 6.0 - 8.3 g/dL   Albumin 4.2 3.5 - 5.2 g/dL   GFR 08.65 >78.46 mL/min   Calcium 9.4 8.4 - 10.5 mg/dL  Hemoglobin N6E   Collection Time: 02/16/24 11:37 AM  Result Value Ref Range   Hgb A1c MFr Bld 6.9 (H) 4.6 - 6.5 %  Lipid panel   Collection Time: 02/16/24 11:37 AM  Result Value Ref Range   Cholesterol 176 0 - 200 mg/dL   Triglycerides 952.8 0.0 - 149.0 mg/dL   HDL 41.32 >44.01 mg/dL   VLDL 02.7 0.0 - 25.3  mg/dL   LDL Cholesterol 98 0 - 99 mg/dL   Total CHOL/HDL Ratio 3    NonHDL 124.36   TSH   Collection Time: 02/16/24 11:37 AM  Result Value Ref Range   TSH 1.27 0.35 - 5.50 uIU/mL

## 2024-02-12 NOTE — Patient Instructions (Incomplete)
It was great to see you today.

## 2024-02-16 ENCOUNTER — Encounter: Payer: Self-pay | Admitting: Family Medicine

## 2024-02-16 ENCOUNTER — Ambulatory Visit: Payer: Self-pay | Admitting: Family Medicine

## 2024-02-16 VITALS — BP 122/80 | HR 77 | Temp 98.2°F | Resp 18 | Ht 70.0 in | Wt 250.0 lb

## 2024-02-16 DIAGNOSIS — Z13220 Encounter for screening for lipoid disorders: Secondary | ICD-10-CM

## 2024-02-16 DIAGNOSIS — Z130 Encounter for screening for diseases of the blood and blood-forming organs and certain disorders involving the immune mechanism: Secondary | ICD-10-CM

## 2024-02-16 DIAGNOSIS — G8929 Other chronic pain: Secondary | ICD-10-CM

## 2024-02-16 DIAGNOSIS — R0981 Nasal congestion: Secondary | ICD-10-CM

## 2024-02-16 DIAGNOSIS — M25571 Pain in right ankle and joints of right foot: Secondary | ICD-10-CM

## 2024-02-16 DIAGNOSIS — R7309 Other abnormal glucose: Secondary | ICD-10-CM

## 2024-02-16 DIAGNOSIS — Z1329 Encounter for screening for other suspected endocrine disorder: Secondary | ICD-10-CM

## 2024-02-16 DIAGNOSIS — Z131 Encounter for screening for diabetes mellitus: Secondary | ICD-10-CM

## 2024-02-16 DIAGNOSIS — M25572 Pain in left ankle and joints of left foot: Secondary | ICD-10-CM

## 2024-02-16 LAB — LIPID PANEL
Cholesterol: 176 mg/dL (ref 0–200)
HDL: 51.3 mg/dL (ref >39.00)
LDL Cholesterol: 98 mg/dL (ref 0–99)
NonHDL: 124.36
Total CHOL/HDL Ratio: 3
Triglycerides: 130 mg/dL (ref 0.0–149.0)
VLDL: 26 mg/dL (ref 0.0–40.0)

## 2024-02-16 LAB — TSH: TSH: 1.27 u[IU]/mL (ref 0.35–5.50)

## 2024-02-16 LAB — CBC
HCT: 42.4 % (ref 36.0–46.0)
Hemoglobin: 14.1 g/dL (ref 12.0–15.0)
MCHC: 33.2 g/dL (ref 30.0–36.0)
MCV: 86.9 fl (ref 78.0–100.0)
Platelets: 281 10*3/uL (ref 150.0–400.0)
RBC: 4.87 Mil/uL (ref 3.87–5.11)
RDW: 14.1 % (ref 11.5–15.5)
WBC: 6.4 10*3/uL (ref 4.0–10.5)

## 2024-02-16 LAB — COMPREHENSIVE METABOLIC PANEL WITH GFR
ALT: 32 U/L (ref 0–35)
AST: 18 U/L (ref 0–37)
Albumin: 4.2 g/dL (ref 3.5–5.2)
Alkaline Phosphatase: 69 U/L (ref 39–117)
BUN: 10 mg/dL (ref 6–23)
CO2: 32 meq/L (ref 19–32)
Calcium: 9.4 mg/dL (ref 8.4–10.5)
Chloride: 102 meq/L (ref 96–112)
Creatinine, Ser: 0.74 mg/dL (ref 0.40–1.20)
GFR: 87.04 mL/min (ref >60.00)
Glucose, Bld: 128 mg/dL — ABNORMAL HIGH (ref 70–99)
Potassium: 3.9 meq/L (ref 3.5–5.1)
Sodium: 140 meq/L (ref 135–145)
Total Bilirubin: 0.4 mg/dL (ref 0.2–1.2)
Total Protein: 7.4 g/dL (ref 6.0–8.3)

## 2024-02-16 LAB — HEMOGLOBIN A1C: Hgb A1c MFr Bld: 6.9 % — ABNORMAL HIGH (ref 4.6–6.5)

## 2024-02-16 MED ORDER — AMOXICILLIN 875 MG PO TABS: 875.0000 mg | ORAL_TABLET | Freq: Two times a day (BID) | ORAL | 0 refills | Status: DC

## 2024-02-16 NOTE — Addendum Note (Signed)
 Addended by: Gates Kasal C on: 02/16/2024 08:16 PM   Modules accepted: Orders

## 2024-02-17 ENCOUNTER — Encounter: Payer: Self-pay | Admitting: Family Medicine

## 2024-02-18 ENCOUNTER — Telehealth: Payer: Self-pay

## 2024-02-18 NOTE — Telephone Encounter (Signed)
 Called and reviewed lab results. Pt aware and voices understanding. Results have been mailed per pt request.

## 2024-02-18 NOTE — Telephone Encounter (Signed)
 Copied from CRM [id number]. Topic: General - Other >> Feb 18, 2024  2:18 PM Emylou G wrote: Reason for CRM: Patient called.. checking status of labs

## 2024-03-01 ENCOUNTER — Ambulatory Visit (INDEPENDENT_AMBULATORY_CARE_PROVIDER_SITE_OTHER): Admitting: Sports Medicine

## 2024-03-01 ENCOUNTER — Encounter: Payer: Self-pay | Admitting: Sports Medicine

## 2024-03-01 VITALS — BP 124/62 | Ht 70.0 in | Wt 250.0 lb

## 2024-03-01 DIAGNOSIS — M76821 Posterior tibial tendinitis, right leg: Secondary | ICD-10-CM | POA: Diagnosis not present

## 2024-03-01 NOTE — Progress Notes (Signed)
   Subjective:    Patient ID: Kristin Nguyen, female    DOB: May 22, 1961, 63 y.o.   MRN: [id number]  HPI chief complaint: Right ankle pain  Kristin Nguyen is a very pleasant 63 year old female that presents today with worsening right ankle pain and swelling.  She has had issues with the same ankle for many years.  She first noticed pain while serving in the [employer].  She was diagnosed with a stress fracture at that time.  Her pain and swelling are all along the medial ankle.  No recent trauma.  No prior ankle surgery.  She has not tried any over-the-counter pain medication.  No imaging.  Past medical history reviewed Medications reviewed Allergies reviewed  Review of Systems As above    Objective:   Physical Exam  Well-developed, well-nourished.  No acute distress  Right ankle: Full range of motion.  No effusion.  There is some mild swelling along the course of the posterior tibialis tendon.  Pronounced pes planus with standing.  Good calcaneal inversion when standing on tiptoes.  Good pulses.  Limited MS[facility] ultrasound of the medial right ankle shows swelling around the posterior tibialis tendon      Assessment & Plan:   Posterior tibialis tendinitis, right ankle  We discussed the importance of good supportive shoes.  We also discussed possible custom orthotics at a later date.  She may use over-the-counter anti-inflammatories as needed for her pain and swelling.  I recommend a compression sleeve with activity.  I also think she would benefit from some limited physical therapy.  [facility] will refer [facility] to [facility] for [facility].  She will follow-up with me as needed.  This note was dictated using Dragon naturally speaking software and may contain errors in syntax, spelling, or content which have not been identified prior to signing this note.

## 2024-03-02 ENCOUNTER — Other Ambulatory Visit: Payer: Self-pay | Admitting: Family Medicine

## 2024-03-24 ENCOUNTER — Other Ambulatory Visit

## 2024-03-31 ENCOUNTER — Other Ambulatory Visit

## 2024-04-07 ENCOUNTER — Other Ambulatory Visit (INDEPENDENT_AMBULATORY_CARE_PROVIDER_SITE_OTHER)

## 2024-04-07 DIAGNOSIS — R7309 Other abnormal glucose: Secondary | ICD-10-CM

## 2024-04-07 LAB — HEMOGLOBIN A1C
Hgb A1c MFr Bld: 6.9 % — ABNORMAL HIGH (ref <5.7)
Mean Plasma Glucose: 151 mg/dL
eAG (mmol/L): 8.4 mmol/L

## 2024-04-07 NOTE — Addendum Note (Signed)
 Addended by: Delayne Feather on: 04/07/2024 04:03 PM   Modules accepted: Orders

## 2024-04-08 ENCOUNTER — Encounter: Payer: Self-pay | Admitting: Family Medicine

## 2024-04-08 DIAGNOSIS — E119 Type 2 diabetes mellitus without complications: Secondary | ICD-10-CM | POA: Insufficient documentation

## 2024-06-10 ENCOUNTER — Ambulatory Visit (HOSPITAL_COMMUNITY)
Admission: EM | Admit: 2024-06-10 | Discharge: 2024-06-10 | Disposition: A | Discharge status: Home / Self Care | Attending: Emergency Medicine | Admitting: Emergency Medicine

## 2024-06-10 ENCOUNTER — Ambulatory Visit (INDEPENDENT_AMBULATORY_CARE_PROVIDER_SITE_OTHER)

## 2024-06-10 ENCOUNTER — Encounter (HOSPITAL_COMMUNITY): Payer: Self-pay

## 2024-06-10 DIAGNOSIS — S92352A Displaced fracture of fifth metatarsal bone, left foot, initial encounter for closed fracture: Secondary | ICD-10-CM

## 2024-06-10 MED ORDER — HYDROCODONE-ACETAMINOPHEN 5-325 MG PO TABS: 1.0000 | ORAL_TABLET | Freq: Four times a day (QID) | ORAL | 0 refills | Status: DC | PRN

## 2024-06-10 NOTE — ED Provider Notes (Addendum)
 [facility]   MRN: [id number] DOB: 1960/11/22  Subjective:   Kristin Nguyen is a 63 y.o. female presenting for left lateral ankle and foot pain and swelling.  She is accompanied by her husband today.  Patient reports that approximately 2 hours ago she was coming out of a nail salon, wearing wedge sandals, and her ankle gave out.  She is able to bear some weight.  She is walking with a walking stick and visible limp.  She has not tried any over-the-counter treatments or other remedies.  Patient reports they came straight here to seek evaluation.  No prior history of injury to this ankle.  No current facility-administered medications for this encounter.  Current Outpatient Medications:    acetaminophen  (TYLENOL ) 500 MG tablet, Take 1,000 mg by mouth 2 (two) times daily., Disp: , Rfl:    calcium carbonate (OS-CAL - DOSED IN MG OF ELEMENTAL CALCIUM) 1250 (500 Ca) MG tablet, Take 1,250 mg by mouth., Disp: , Rfl:    cyanocobalamin (VITAMIN B12) 500 MCG tablet, Take 2 tablets by mouth daily., Disp: , Rfl:    dorzolamide-timolol (COSOPT) 2-0.5 % ophthalmic solution, dorzolamide 22.3 mg-timolol 6.8 mg/mL eye drops  INSTILL 1 DROP INTO BOTH EYE TWICE DAILY AS DIRECTED, Disp: , Rfl:    Glucosamine-Chondroitin (COSAMIN DS PO), Take 2 tablets by mouth daily., Disp: , Rfl:    HYDROcodone -acetaminophen  (NORCO/VICODIN) 5-325 MG tablet, Take 1 tablet by mouth every 6 (six) hours as needed for moderate pain (pain score 4-6) or severe pain (pain score 7-10)., Disp: 10 tablet, Rfl: 0   Multiple Vitamin (MULTIVITAMIN WITH MINERALS) TABS tablet, Take 1 tablet by mouth daily., Disp: , Rfl:    naphazoline-glycerin (CLEAR EYES REDNESS) 0.012-0.2 % SOLN, Place 1-2 drops into both eyes 4 (four) times daily as needed for eye irritation (for eye redness.)., Disp: , Rfl:    Omega-3 Fatty Acids (FISH OIL PO), Take 2 capsules by mouth daily., Disp: , Rfl:    No Known Allergies  Past Medical History:   Diagnosis Date   Glaucoma    Medical history non-contributory      Past Surgical History:  Procedure Laterality Date   HERNIA REPAIR Bilateral    Inguinal   RADIOLOGY WITH ANESTHESIA N/A 10/28/2017   Procedure: MRI WITH ANESTHESIA OF LUMBAR SPINE WITHOUT CONTRAST;  Surgeon: Radiologist, Medication, MD;  Location: [facility] OR;  Service: Radiology;  Laterality: N/A;    Family History  Problem Relation Age of Onset   Aneurysm Mother        Cerbral-  mixiture of diet pills and birth control pills   Hypertension Father    Diabetes Father    Prostate cancer Father     Social History   Tobacco Use   Smoking status: Never   Smokeless tobacco: Never  Vaping Use   Vaping status: Never Used  Substance Use Topics   Alcohol use: No   Drug use: No    ROS REFER TO HPI FOR PERTINENT POSITIVES AND NEGATIVES   Objective:   Vitals: BP 124/84 (BP Location: Left Arm)   Pulse 69   Temp 98.3 F (36.8 C) (Oral)   Resp 18   SpO2 93%   Physical Exam Vitals and nursing note reviewed.  Constitutional:      Appearance: Normal appearance. She is obese.  Musculoskeletal:     Left ankle: Swelling present. No lacerations. Tenderness present. Decreased range of motion. Normal pulse.     Left Achilles Tendon:  Normal.     Left foot: Normal capillary refill. Swelling and tenderness (left lateral foot) present. Normal pulse.     Comments: Sensation intact   Skin:    Findings: No lesion or rash.  Neurological:     General: No focal deficit present.     Mental Status: She is alert and oriented to person, place, and time.  Psychiatric:        Mood and Affect: Mood normal.        Behavior: Behavior normal.     No results found for this or any previous visit (from the past 24 hours).  Assessment and Plan :   I have reviewed the PDMP during this encounter.  1. Displaced fracture of fifth metatarsal bone, left foot, initial encounter for closed fracture     DG Ankle Complete Left CLINICAL  DATA:  Left ankle pain and swelling, injury  EXAM: LEFT ANKLE COMPLETE - 3+ VIEW  COMPARISON:  None Available.  FINDINGS: Frontal, oblique, and lateral views of the left ankle are obtained on 4 images. There is a minimally displaced transverse extra-articular fracture at the base of the fifth metatarsal, with near anatomic alignment. No other acute bony abnormalities. Joint spaces are well preserved. Soft tissue swelling within the anterior and lateral aspect of the ankle and hindfoot.  IMPRESSION: 1. Minimally displaced transverse fracture at the base of the fifth metatarsal consistent with a Jones fracture, with near anatomic alignment. 2. Anterolateral soft tissue swelling of the ankle and hindfoot.  Electronically Signed   By: Ozell Daring M.D.   On: 06/10/2024 15:52   Well-appearing 63 year old female with acute pain and injury of left foot and ankle.  I personally reviewed her x-ray.  She does have a minimally displaced Jones fracture of her left foot.  She was placed in a cam walking boot.  I offered crutches, but she declined, and said that she would continue to use her walking cane with the walking boot.  Aftercare instructions provided.  She is going to follow-up with [facility] on Monday.  Information provided in the after visit summary for her.  I also provided a prescription of 10 pills for Norco in case of moderate to severe pain this weekend. Pt aware of risks vs benefits and possible adverse reactions.   All questions answered.  Patient and her husband agreeable understanding of the plan.   Timia Casselman, Mardy HERO, PA-C 06/10/24 1619  Addendum: Patient could not ambulate with the walking boot and her walking cane.  She changed her mind about the crutches and was discharged with crutches. Education provided how to use them properly.     AllwardtMardy HERO, PA-C 06/10/24 1645

## 2024-06-10 NOTE — Discharge Instructions (Addendum)
 Good to meet you today. You have a broken bone in your left foot. Wear the boot with ambulation and use your walking cane. I do recommend trying to stay off and elevate this foot as much as you can this weekend. Ice, Tylenol , Ibuprofen as needed. Norco if needed for moderate or severe pain, caution drowsiness and constipation possible.  Follow-up with [facility] on Monday.   Ask your PCP about bone density testing.

## 2024-06-10 NOTE — ED Triage Notes (Signed)
 Patient presenting with left ankle pain and swelling onset around 2 hours ago. Patient states she came out of the nail salon wearing wedges and the left ankle turned over. Patient walking with a walking stick and visible limp.  Prescriptions or OTC medications tried: No

## 2024-06-13 ENCOUNTER — Ambulatory Visit (INDEPENDENT_AMBULATORY_CARE_PROVIDER_SITE_OTHER)

## 2024-06-13 ENCOUNTER — Ambulatory Visit: Admitting: Podiatry

## 2024-06-13 ENCOUNTER — Telehealth: Payer: Self-pay

## 2024-06-13 ENCOUNTER — Encounter: Payer: Self-pay | Admitting: Podiatry

## 2024-06-13 DIAGNOSIS — S92355A Nondisplaced fracture of fifth metatarsal bone, left foot, initial encounter for closed fracture: Secondary | ICD-10-CM | POA: Diagnosis not present

## 2024-06-13 DIAGNOSIS — H2513 Age-related nuclear cataract, bilateral: Secondary | ICD-10-CM | POA: Insufficient documentation

## 2024-06-13 DIAGNOSIS — H401134 Primary open-angle glaucoma, bilateral, indeterminate stage: Secondary | ICD-10-CM | POA: Insufficient documentation

## 2024-06-13 DIAGNOSIS — Z789 Other specified health status: Secondary | ICD-10-CM | POA: Insufficient documentation

## 2024-06-13 DIAGNOSIS — Z9889 Other specified postprocedural states: Secondary | ICD-10-CM | POA: Insufficient documentation

## 2024-06-13 DIAGNOSIS — F4312 Post-traumatic stress disorder, chronic: Secondary | ICD-10-CM | POA: Insufficient documentation

## 2024-06-13 DIAGNOSIS — F39 Unspecified mood [affective] disorder: Secondary | ICD-10-CM | POA: Insufficient documentation

## 2024-06-13 DIAGNOSIS — Z7729 Contact with and (suspected ) exposure to other hazardous substances: Secondary | ICD-10-CM | POA: Insufficient documentation

## 2024-06-13 DIAGNOSIS — M545 Low back pain, unspecified: Secondary | ICD-10-CM | POA: Insufficient documentation

## 2024-06-13 DIAGNOSIS — Z719 Counseling, unspecified: Secondary | ICD-10-CM | POA: Insufficient documentation

## 2024-06-13 DIAGNOSIS — G4700 Insomnia, unspecified: Secondary | ICD-10-CM | POA: Insufficient documentation

## 2024-06-13 DIAGNOSIS — M5441 Lumbago with sciatica, right side: Secondary | ICD-10-CM | POA: Insufficient documentation

## 2024-06-13 DIAGNOSIS — M25562 Pain in left knee: Secondary | ICD-10-CM | POA: Insufficient documentation

## 2024-06-13 DIAGNOSIS — E669 Obesity, unspecified: Secondary | ICD-10-CM | POA: Insufficient documentation

## 2024-06-13 DIAGNOSIS — H409 Unspecified glaucoma: Secondary | ICD-10-CM | POA: Insufficient documentation

## 2024-06-13 NOTE — Telephone Encounter (Signed)
 Attempted to call patient to discuss. Went straight to [facility] was full. Will try again later. thanks

## 2024-06-13 NOTE — Telephone Encounter (Signed)
-----   Message from Elayne Knee sent at 06/13/2024  8:45 AM EDT ----- Walterine! This patient is on night clinic and Dr. Verta said she was just at [facility] for fracture 3 days ago. She was put in a boot, but she also needs to be non-weight bearing. It appears they offered her crutches but she declined... not sure if she had some at home or what, but she either needs crutches or knee scooter. Tell her knee scooter is easier for her to use, but if she continues to walk on it the fracture could worsen. He wants to see her in 2-3 weeks for re-xray and continue boot and NWB status until then.

## 2024-06-14 NOTE — Progress Notes (Signed)
 Subjective:  Patient ID: Kristin Nguyen, female    DOB: [birth year],  MRN: [id number] HPI Chief Complaint  Patient presents with   Foot Injury    Lateral foot - rolled foot on Saturday (8/16) - swollen and sore, went to Urgent Care-xrayed - said had 5th met fracture, put in boot and crutches-semi weight bearing   New Patient (Initial Visit)    63 y.o. female presents with the above complaint.   ROS: Denies fever chills nausea vomiting muscle aches pains calf pain back pain chest pain shortness of breath.  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  She states that it felt more like the ankle and had swelling on both sides.  She went to [facility] urgent care and was told that she had a fractured fifth metatarsal that she needed to wear the boot that they provided for her and use crutches.  She was then sent urgently to us .  Past Medical History:  Diagnosis Date   Glaucoma    Medical history non-contributory    Past Surgical History:  Procedure Laterality Date   HERNIA REPAIR Bilateral    Inguinal   RADIOLOGY WITH ANESTHESIA N/A 10/28/2017   Procedure: MRI WITH ANESTHESIA OF LUMBAR SPINE WITHOUT CONTRAST;  Surgeon: Radiologist, Medication, MD;  Location: [facility] OR;  Service: Radiology;  Laterality: N/A;    Current Outpatient Medications:    acetaminophen  (TYLENOL ) 500 MG tablet, Take 1,000 mg by mouth 2 (two) times daily., Disp: , Rfl:    calcium carbonate (OS-CAL - DOSED IN MG OF ELEMENTAL CALCIUM) 1250 (500 Ca) MG tablet, Take 1,250 mg by mouth., Disp: , Rfl:    cyanocobalamin (VITAMIN B12) 500 MCG tablet, Take 2 tablets by mouth daily., Disp: , Rfl:    dorzolamide-timolol (COSOPT) 2-0.5 % ophthalmic solution, dorzolamide 22.3 mg-timolol 6.8 mg/mL eye drops  INSTILL 1 DROP INTO BOTH EYE TWICE DAILY AS DIRECTED, Disp: , Rfl:    Glucosamine-Chondroitin (COSAMIN DS PO), Take 2 tablets by mouth  daily., Disp: , Rfl:    HYDROcodone -acetaminophen  (NORCO/VICODIN) 5-325 MG tablet, Take 1 tablet by mouth every 6 (six) hours as needed for moderate pain (pain score 4-6) or severe pain (pain score 7-10)., Disp: 10 tablet, Rfl: 0   Multiple Vitamin (MULTIVITAMIN WITH MINERALS) TABS tablet, Take 1 tablet by mouth daily., Disp: , Rfl:    naphazoline-glycerin (CLEAR EYES REDNESS) 0.012-0.2 % SOLN, Place 1-2 drops into both eyes 4 (four) times daily as needed for eye irritation (for eye redness.)., Disp: , Rfl:    Omega-3 Fatty Acids (FISH OIL PO), Take 2 capsules by mouth daily., Disp: , Rfl:   No Known Allergies Review of Systems Objective:  There were no vitals filed for this visit.  General: Well developed, nourished, in no acute distress, alert and oriented x3   Dermatological: Skin is warm, dry and supple bilateral. Nails x 10 are well maintained; remaining integument appears unremarkable at this time. There are no open sores, no preulcerative lesions, no rash or signs of infection present.  Vascular: Dorsalis Pedis artery and Posterior Tibial artery pedal pulses are 2/4 bilateral with immedate capillary fill time. Pedal hair growth present. No varicosities and no lower extremity edema present bilateral.   Neruologic: Grossly intact via light touch bilateral. Vibratory intact via tuning fork bilateral. Protective threshold with Semmes Wienstein monofilament intact to all pedal sites bilateral. Patellar and Achilles deep tendon  reflexes 2+ bilateral. No Babinski or clonus noted bilateral.   Musculoskeletal: No gross boney pedal deformities bilateral. No pain, crepitus, or limitation noted with foot and ankle range of motion bilateral. Muscular strength 5/5 in all groups tested bilateral.  Edema left ankle left lateral foot.  She has pain on palpation overlying fifth metatarsal base.  Gait: Unassisted, Nonantalgic.    Radiographs:  Radiographs taken today partial weightbearing i.e. foot at  90 degrees to the leg.  Demonstrate osseously mature individual left foot soft tissue swelling around the ankle no ankle involvement though she does have a 3 mm vertical fracture of the fifth metatarsal base.  Not a Jones fracture in good anatomical alignment  Assessment & Plan:   Assessment: Fracture fifth met base left.  Plan: Discussed with her and her husband the importance of nonweightbearing with this.  Recommended that she obtain a knee scooter for ease of mobility.  Recommend that she stay in the boot at all times if possible I will follow-up with her in 4 weeks for another set of x-rays recommended calcium and vitamin D.     Tatsuo Musial T. Goodman, [place]

## 2024-06-16 NOTE — Telephone Encounter (Signed)
 Spoke to patient and she states has purchased knee scooter to help with pressure also stated has an upcoming appointment with us .

## 2024-07-04 ENCOUNTER — Encounter: Payer: Self-pay | Admitting: Podiatry

## 2024-07-04 ENCOUNTER — Ambulatory Visit (INDEPENDENT_AMBULATORY_CARE_PROVIDER_SITE_OTHER)

## 2024-07-04 ENCOUNTER — Ambulatory Visit: Admitting: Podiatry

## 2024-07-04 DIAGNOSIS — S92355D Nondisplaced fracture of fifth metatarsal bone, left foot, subsequent encounter for fracture with routine healing: Secondary | ICD-10-CM

## 2024-07-04 DIAGNOSIS — E559 Vitamin D deficiency, unspecified: Secondary | ICD-10-CM

## 2024-07-05 ENCOUNTER — Telehealth: Payer: Self-pay | Admitting: Podiatry

## 2024-07-05 NOTE — Telephone Encounter (Signed)
 DOS- 07/07/2024  OPEN TREATMENT METATARSAL 1X ORIF LT- 28485  [employer] EFFECTIVE DATE- 12/25/2023  DEDUCTIBLE- $3000 REMAINING- $2481.87 OOP- $5500 REMAINING- $5331.88 COINSURANCE- 0%  PER [employer] WEBSITE, NO PRIOR AUTH IS REQUIRED FOR CPT CODE 71514. DECISION ID# [id number]

## 2024-07-05 NOTE — Progress Notes (Signed)
  Subjective:  Patient ID: Kristin Nguyen, female    DOB: 12-Feb-1961,  MRN: [id number]  Chief Complaint  Patient presents with   Fracture    Follow up fracture 5th met left   I really can't tell how it feels    63 y.o. female presents with the above complaint. History confirmed with patient.  She is a hide she has a metatarsal fracture on the left foot she is utilizing a knee scooter  Objective:  Physical Exam: warm, good capillary refill, no trophic changes or ulcerative lesions, normal DP and PT pulses, normal sensory exam, and edema swelling and tenderness over the fifth metatarsal base there is bruising distally on the toes.   Radiographs: Multiple views x-ray of the left foot: No interval bridging and some increase in plantar gapping fifth metatarsal fracture site Assessment:   1. Closed nondisplaced fracture of fifth metatarsal bone of left foot with routine healing, subsequent encounter   2. Vitamin D  deficiency      Plan:  Patient was evaluated and treated and all questions answered.  We reviewed her radiographs and does have some increased gapping at the fifth metatarsal fracture site.  We discussed the benefits of operative intervention at this stage.  I do think this will offer her an earlier recovery to weightbearing as well as a low chance of refracture in the long-term.  We discussed the risks and benefits of surgery including the risk of hardware complication infection and nonunion.  All questions addressed.  Informed consent signed and reviewed.  Surgery scheduled for this Friday.  Continue nonweightbearing with knee scooter.  Vitamin D  level ordered.  Her A1c is adequate for surgery.    Surgical plan:  Procedure: - Left fifth metatarsal ORIF  Location: - [facility]  Anesthesia plan: - Sedation with regional block  Postoperative pain plan: - Tylenol  1000 mg every 6 hours, ibuprofen  600 mg every 8 hours, gabapentin  300 mg every 8 hours x5 days, oxycodone  5 mg  1-2 tabs every 6 hours only as needed  DVT prophylaxis: - ASA 325 mg twice daily  WB Restrictions / DME needs: - Nonweightbearing in CAM boot with knee scooter postop   No follow-ups on file.

## 2024-07-06 ENCOUNTER — Ambulatory Visit: Payer: Self-pay | Admitting: Podiatry

## 2024-07-06 ENCOUNTER — Telehealth: Payer: Self-pay | Admitting: Lab

## 2024-07-06 LAB — VITAMIN D 25 HYDROXY (VIT D DEFICIENCY, FRACTURES): Vit D, 25-Hydroxy: 46.1 ng/mL (ref 30.0–100.0)

## 2024-07-06 NOTE — Telephone Encounter (Signed)
 Patient is having surgery tomorrow but is unable to get the gel polish off nails prior to surgery what should she do?

## 2024-07-06 NOTE — Telephone Encounter (Signed)
 Informed patient

## 2024-07-07 ENCOUNTER — Other Ambulatory Visit: Payer: Self-pay | Admitting: Podiatry

## 2024-07-07 ENCOUNTER — Encounter: Payer: Self-pay | Admitting: Podiatry

## 2024-07-07 DIAGNOSIS — S92355D Nondisplaced fracture of fifth metatarsal bone, left foot, subsequent encounter for fracture with routine healing: Secondary | ICD-10-CM | POA: Diagnosis not present

## 2024-07-07 MED ORDER — ACETAMINOPHEN 500 MG PO TABS: 1000.0000 mg | ORAL_TABLET | Freq: Four times a day (QID) | ORAL | 0 refills | Status: AC | PRN

## 2024-07-07 MED ORDER — OXYCODONE HCL 5 MG PO TABS: 5.0000 mg | ORAL_TABLET | ORAL | 0 refills | Status: AC | PRN

## 2024-07-07 MED ORDER — ASPIRIN 325 MG PO TBEC: 325.0000 mg | DELAYED_RELEASE_TABLET | Freq: Two times a day (BID) | ORAL | 0 refills | Status: AC

## 2024-07-07 MED ORDER — IBUPROFEN 600 MG PO TABS: 600.0000 mg | ORAL_TABLET | Freq: Four times a day (QID) | ORAL | 0 refills | Status: AC | PRN

## 2024-07-07 MED ORDER — GABAPENTIN 300 MG PO CAPS: 300.0000 mg | ORAL_CAPSULE | Freq: Three times a day (TID) | ORAL | 0 refills | Status: AC

## 2024-07-13 ENCOUNTER — Ambulatory Visit (INDEPENDENT_AMBULATORY_CARE_PROVIDER_SITE_OTHER): Admitting: Podiatry

## 2024-07-13 ENCOUNTER — Ambulatory Visit (INDEPENDENT_AMBULATORY_CARE_PROVIDER_SITE_OTHER)

## 2024-07-13 VITALS — Ht 70.0 in | Wt 250.0 lb

## 2024-07-13 DIAGNOSIS — S92355D Nondisplaced fracture of fifth metatarsal bone, left foot, subsequent encounter for fracture with routine healing: Secondary | ICD-10-CM

## 2024-07-14 NOTE — Progress Notes (Deleted)
 [facility] [address] [place], [place] [address] [phone]  Date:  07/17/2024   Name:  TOTIANA EVERSON   DOB:  November 20, 1960   MRN:  [id number]  PCP:  Watt Harlene BROCKS, MD    Chief Complaint: No chief complaint on file.   History of Present Illness:  FRADEL BALDONADO is a 63 y.o. very pleasant female patient who presents with the following:  Patient seen today for an issue with her thumb, she is also interested in a bone density test I saw her most recently to establish care in April of this year-we did diagnose mild diabetes at that time  - Can update tetanus -Flu shot - Annual eye exam - Recommend COVID booster - Foot exam - Urine micro -can give pneumonia vaccine  Discussed the use of AI scribe software for clinical note transcription with the patient, who gave verbal consent to proceed.  History of Present Illness     Patient Active Problem List   Diagnosis Date Noted   Age-related nuclear cataract, bilateral 06/13/2024   Arthralgia of both knees 06/13/2024   Counseling, unspecified 06/13/2024   Exposure to potentially hazardous substance 06/13/2024   Glaucoma 06/13/2024   Health maintenance alteration 06/13/2024   History of loop electrosurgical excision procedure (LEEP) 06/13/2024   Insomnia 06/13/2024   Low back pain, unspecified 06/13/2024   Lumbago with sciatica, right side 06/13/2024   Obesity 06/13/2024   Chronic post-traumatic stress disorder 06/13/2024   Primary open-angle glaucoma, bilateral, indeterminate stage 06/13/2024   Unspecified mood (affective) disorder (HCC) 06/13/2024   Controlled type 2 diabetes mellitus without complication, without long-term current use of insulin (HCC) 04/08/2024    Past Medical History:  Diagnosis Date   Glaucoma    Medical history non-contributory     Past Surgical History:  Procedure Laterality Date   HERNIA REPAIR Bilateral    Inguinal    RADIOLOGY WITH ANESTHESIA N/A 10/28/2017   Procedure: MRI WITH ANESTHESIA OF LUMBAR SPINE WITHOUT CONTRAST;  Surgeon: Radiologist, Medication, MD;  Location: [facility] OR;  Service: Radiology;  Laterality: N/A;    Social History   Tobacco Use   Smoking status: Never   Smokeless tobacco: Never  Vaping Use   Vaping status: Never Used  Substance Use Topics   Alcohol use: No   Drug use: No    Family History  Problem Relation Age of Onset   Aneurysm Mother        Cerbral-  mixiture of diet pills and birth control pills   Hypertension Father    Diabetes Father    Prostate cancer Father     No Known Allergies  Medication list has been reviewed and updated.  Current Outpatient Medications on File Prior to Visit  Medication Sig Dispense Refill   acetaminophen  (TYLENOL ) 500 MG tablet Take 2 tablets (1,000 mg total) by mouth every 6 (six) hours as needed for up to 14 days (pain). 112 tablet 0   aspirin  EC 325 MG tablet Take 1 tablet (325 mg total) by mouth in the morning and at bedtime. 60 tablet 0   calcium carbonate (OS-CAL - DOSED IN MG OF ELEMENTAL CALCIUM) 1250 (500 Ca) MG tablet Take 1,250 mg by mouth.     cyanocobalamin (VITAMIN B12) 500 MCG tablet Take 2 tablets by mouth daily.     dorzolamide-timolol (COSOPT) 2-0.5 % ophthalmic solution dorzolamide 22.3 mg-timolol 6.8 mg/mL eye drops  INSTILL 1 DROP  INTO BOTH EYE TWICE DAILY AS DIRECTED     gabapentin  (NEURONTIN ) 300 MG capsule Take 1 capsule (300 mg total) by mouth 3 (three) times daily for 7 days. 21 capsule 0   Glucosamine-Chondroitin (COSAMIN DS PO) Take 2 tablets by mouth daily.     ibuprofen  (ADVIL ) 600 MG tablet Take 1 tablet (600 mg total) by mouth every 6 (six) hours as needed for up to 14 days. 56 tablet 0   Multiple Vitamin (MULTIVITAMIN WITH MINERALS) TABS tablet Take 1 tablet by mouth daily.     naphazoline-glycerin (CLEAR EYES REDNESS) 0.012-0.2 % SOLN Place 1-2 drops into both eyes 4 (four) times daily as needed for  eye irritation (for eye redness.).     Omega-3 Fatty Acids (FISH OIL PO) Take 2 capsules by mouth daily.     oxyCODONE  (OXY IR/ROXICODONE ) 5 MG immediate release tablet Take 1 tablet (5 mg total) by mouth every 4 (four) hours as needed for up to 7 days for severe pain (pain score 7-10). 20 tablet 0   No current facility-administered medications on file prior to visit.    Review of Systems:  As per HPI- otherwise negative.   Physical Examination: There were no vitals filed for this visit. There were no vitals filed for this visit. There is no height or weight on file to calculate BMI. Ideal Body Weight:    GEN: no acute distress. HEENT: Atraumatic, Normocephalic.  Ears and Nose: No external deformity. CV: RRR, No M/G/R. No JVD. No thrill. No extra heart sounds. PULM: CTA B, no wheezes, crackles, rhonchi. No retractions. No resp. distress. No accessory muscle use. ABD: S, NT, ND, +BS. No rebound. No HSM. EXTR: No c/c/e PSYCH: Normally interactive. Conversant.    Assessment and Plan: No diagnosis found.  Assessment & Plan   Signed Harlene Schroeder, MD

## 2024-07-16 NOTE — Progress Notes (Signed)
  Subjective:  Patient ID: Kristin Nguyen, female    DOB: 1961-06-14,  MRN: [id number]  Chief Complaint  Patient presents with   Post-op Follow-up    Rm 24 POV # 1 DOS 07/07/24 LT 5TH METATARSAL FRACTURE REPAIR. Pt states moderate pain managing with tylenol  and ibuprofen .       63 y.o. female returns for post-op check.  Overall doing well  Review of Systems: Negative except as noted in the HPI. Denies N/V/F/Ch.   Objective:  There were no vitals filed for this visit. Body mass index is 35.87 kg/m. Constitutional Well developed. Well nourished.  Vascular Foot warm and well perfused. Capillary refill normal to all digits.  Calf is soft and supple, no posterior calf or knee pain, negative Homans' sign  Neurologic Normal speech. Oriented to person, place, and time. Epicritic sensation to light touch grossly present bilaterally.  Dermatologic Skin healing well without signs of infection. Skin edges well coapted without signs of infection.  Orthopedic: Tenderness to palpation noted about the surgical site.  Mild edema   Multiple view plain film radiographs: Good correction noted with intramedullary screw Assessment:   1. Closed nondisplaced fracture of fifth metatarsal bone of left foot with routine healing, subsequent encounter    Plan:  Patient was evaluated and treated and all questions answered.  S/p foot surgery left -Progressing as expected post-operatively. -XR: Noted above no issues -WB Status: Nonweightbearing in CAM boot -Sutures: Return in 2 weeks to remove. -Medications: No refills required -Foot redressed.  Only needs a Band-Aid over stitches and may shower as tolerated.  1 incision had a mild amount of maceration so I recommended applying Betadine to this  No follow-ups on file.

## 2024-07-17 ENCOUNTER — Ambulatory Visit: Admitting: Family Medicine

## 2024-07-25 ENCOUNTER — Ambulatory Visit (INDEPENDENT_AMBULATORY_CARE_PROVIDER_SITE_OTHER): Payer: Self-pay | Admitting: Podiatry

## 2024-07-25 ENCOUNTER — Ambulatory Visit (INDEPENDENT_AMBULATORY_CARE_PROVIDER_SITE_OTHER)

## 2024-07-25 ENCOUNTER — Encounter: Payer: Self-pay | Admitting: Podiatry

## 2024-07-25 VITALS — Ht 70.0 in | Wt 250.0 lb

## 2024-07-25 DIAGNOSIS — S92355D Nondisplaced fracture of fifth metatarsal bone, left foot, subsequent encounter for fracture with routine healing: Secondary | ICD-10-CM

## 2024-07-25 NOTE — Progress Notes (Signed)
  Subjective:  Patient ID: Kristin Nguyen, female    DOB: Oct 27, 1960,  MRN: [id number]  Chief Complaint  Patient presents with   Post-op Follow-up    Rm 4 POV # 2 DOS 07/07/24 LT 5TH METATARSAL FRACTURE REPAIR      63 y.o. female returns for post-op check.  Overall doing well  Review of Systems: Negative except as noted in the HPI. Denies N/V/F/Ch.   Objective:  There were no vitals filed for this visit. Body mass index is 35.87 kg/m. Constitutional Well developed. Well nourished.  Vascular Foot warm and well perfused. Capillary refill normal to all digits.  Calf is soft and supple, no posterior calf or knee pain, negative Homans' sign  Neurologic Normal speech. Oriented to person, place, and time. Epicritic sensation to light touch grossly present bilaterally.  Dermatologic Skin healing well without signs of infection. Skin edges well coapted without signs of infection.  Orthopedic: Tenderness to palpation noted about the surgical site.  Mild edema   Multiple view plain film radiographs: Good correction noted with intramedullary screw, no interval change Assessment:   1. Closed nondisplaced fracture of fifth metatarsal bone of left foot with routine healing, subsequent encounter    Plan:  Patient was evaluated and treated and all questions answered.  S/p foot surgery left - Sutures removed uneventfully.  In 2 weeks may begin partial gradual weightbearing with crutches and weightbearing on the heel.  Bathing and skin care as tolerated.  May utilize scar gel such as Mederma.  Compression sleeve applied utilize this for edema during the day.  Return in 3 weeks for new x-rays and will begin transition to full weightbearing and then ankle brace shortly after  No follow-ups on file.

## 2024-07-27 ENCOUNTER — Encounter: Admitting: Podiatry

## 2024-08-16 ENCOUNTER — Ambulatory Visit: Admitting: Family Medicine

## 2024-08-17 ENCOUNTER — Ambulatory Visit: Admitting: Podiatry

## 2024-08-17 ENCOUNTER — Ambulatory Visit

## 2024-08-17 ENCOUNTER — Encounter: Payer: Self-pay | Admitting: Podiatry

## 2024-08-17 VITALS — Ht 70.0 in | Wt 250.0 lb

## 2024-08-17 DIAGNOSIS — S92355D Nondisplaced fracture of fifth metatarsal bone, left foot, subsequent encounter for fracture with routine healing: Secondary | ICD-10-CM

## 2024-08-20 ENCOUNTER — Encounter: Payer: Self-pay | Admitting: Podiatry

## 2024-08-20 NOTE — Progress Notes (Signed)
  Subjective:  Patient ID: Kristin Nguyen, female    DOB: [birth year],  MRN: [id number]  Chief Complaint  Patient presents with   Post-op Follow-up    RM 8 POV # 3 DOS 07/07/24 LT 5TH METATARSAL FRACTURE REPAIR      63 y.o. female returns for post-op check.  Doing well pain is controlled  Review of Systems: Negative except as noted in the HPI. Denies N/V/F/Ch.   Objective:  There were no vitals filed for this visit. Body mass index is 35.87 kg/m. Constitutional Well developed. Well nourished.  Vascular Foot warm and well perfused. Capillary refill normal to all digits.  Calf is soft and supple, no posterior calf or knee pain, negative Homans' sign  Neurologic Normal speech. Oriented to person, place, and time. Epicritic sensation to light touch grossly present bilaterally.  Dermatologic Incision well-healed not hypertrophic  Orthopedic: No pain to palpation   Multiple view plain film radiographs: Correction maintained.  Good early bridging across majority of fracture site Assessment:   1. Closed nondisplaced fracture of fifth metatarsal bone of left foot with routine healing, subsequent encounter    Plan:  Patient was evaluated and treated and all questions answered.  S/p foot surgery left - Doing very well.  Continue CAM boot may be full weightbearing in this.  In 2 weeks may begin gradual transition back to supportive shoe gear as tolerated.  Return in 6 weeks for new x-rays.  Return in about 6 weeks (around 09/28/2024) for post op (new x-rays).

## 2024-08-27 ENCOUNTER — Encounter: Payer: Self-pay | Admitting: Podiatry

## 2024-08-28 ENCOUNTER — Encounter: Payer: Self-pay | Admitting: Radiology

## 2024-09-10 NOTE — Progress Notes (Deleted)
 [facility] [address], [address] [place], [place] [address] [phone]  Date:  09/13/2024   Name:  Kristin Nguyen   DOB:  10-01-1961   MRN:  [id number]  PCP:  Watt Harlene BROCKS, MD    Chief Complaint: No chief complaint on file.   History of Present Illness:  Kristin Nguyen is a 63 y.o. very pleasant female patient who presents with the following:  Patient seen today with concern of her thumb hurting, she would also like to get a bone density test History of controlled diabetes, obesity, glaucoma Most recent visit with myself was in April of this year-at that time we establish a new care relationship.  Her mother has been my patient for years She has lived in [place] since the 1990s She works in [place] She has 2 adult kids and 3 grands, married for 30+ years Son lives with  her, daughter and 3 grandchildren in in [place]  In her free time she enjoys exercise -strength and cardio training  Bone density on chart from about 2 years ago-[facility].  While we prefer to keep her on the same machine for consistency, her result was normal at that time Mammogram done in March, negative We got blood work in [facility] 6.9%  Can update A1c Offer tetanus Eye exam Needs urine micro Offer pneumonia vaccine Flu shot   Discussed the use of AI scribe software for clinical note transcription with the patient, who gave verbal consent to proceed.  History of Present Illness    Patient Active Problem List   Diagnosis Date Noted   Age-related nuclear cataract, bilateral 06/13/2024   Arthralgia of both knees 06/13/2024   Counseling, unspecified 06/13/2024   Exposure to potentially hazardous substance 06/13/2024   Glaucoma 06/13/2024   Health maintenance alteration 06/13/2024   History of loop electrosurgical excision procedure (LEEP) 06/13/2024   Insomnia 06/13/2024   Low back pain, unspecified 06/13/2024   Lumbago  with sciatica, right side 06/13/2024   Obesity 06/13/2024   Chronic post-traumatic stress disorder 06/13/2024   Primary open-angle glaucoma, bilateral, indeterminate stage 06/13/2024   Unspecified mood (affective) disorder 06/13/2024   Controlled type 2 diabetes mellitus without complication, without long-term current use of insulin (HCC) 04/08/2024    Past Medical History:  Diagnosis Date   Glaucoma    Medical history non-contributory     Past Surgical History:  Procedure Laterality Date   HERNIA REPAIR Bilateral    Inguinal   RADIOLOGY WITH ANESTHESIA N/A 10/28/2017   Procedure: MRI WITH ANESTHESIA OF LUMBAR SPINE WITHOUT CONTRAST;  Surgeon: Radiologist, Medication, MD;  Location: [facility] OR;  Service: Radiology;  Laterality: N/A;    Social History   Tobacco Use   Smoking status: Never   Smokeless tobacco: Never  Vaping Use   Vaping status: Never Used  Substance Use Topics   Alcohol use: No   Drug use: No    Family History  Problem Relation Age of Onset   Aneurysm Mother        Cerbral-  mixiture of diet pills and birth control pills   Hypertension Father    Diabetes Father    Prostate cancer Father     No Known Allergies  Medication list has been reviewed and updated.  Current Outpatient Medications on File Prior to Visit  Medication Sig Dispense Refill   calcium carbonate (OS-CAL - DOSED IN MG OF ELEMENTAL CALCIUM) 1250 (500  Ca) MG tablet Take 1,250 mg by mouth.     cyanocobalamin (VITAMIN B12) 500 MCG tablet Take 2 tablets by mouth daily.     dorzolamide-timolol (COSOPT) 2-0.5 % ophthalmic solution dorzolamide 22.3 mg-timolol 6.8 mg/mL eye drops  INSTILL 1 DROP INTO BOTH EYE TWICE DAILY AS DIRECTED     gabapentin  (NEURONTIN ) 300 MG capsule Take 1 capsule (300 mg total) by mouth 3 (three) times daily for 7 days. 21 capsule 0   Glucosamine-Chondroitin (COSAMIN DS PO) Take 2 tablets by mouth daily.     Multiple Vitamin (MULTIVITAMIN WITH MINERALS) TABS tablet Take 1  tablet by mouth daily.     naphazoline-glycerin (CLEAR EYES REDNESS) 0.012-0.2 % SOLN Place 1-2 drops into both eyes 4 (four) times daily as needed for eye irritation (for eye redness.).     Omega-3 Fatty Acids (FISH OIL PO) Take 2 capsules by mouth daily.     No current facility-administered medications on file prior to visit.    Review of Systems:  As per HPI- otherwise negative.   Physical Examination: There were no vitals filed for this visit. There were no vitals filed for this visit. There is no height or weight on file to calculate BMI. Ideal Body Weight:    GEN: no acute distress. HEENT: Atraumatic, Normocephalic.  Ears and Nose: No external deformity. CV: RRR, No M/G/R. No JVD. No thrill. No extra heart sounds. PULM: CTA B, no wheezes, crackles, rhonchi. No retractions. No resp. distress. No accessory muscle use. ABD: S, NT, ND, +BS. No rebound. No HSM. EXTR: No c/c/e PSYCH: Normally interactive. Conversant.    Assessment and Plan: No diagnosis found.  Assessment & Plan   Signed Harlene Schroeder, MD

## 2024-09-13 ENCOUNTER — Ambulatory Visit: Admitting: Family Medicine

## 2024-10-03 ENCOUNTER — Ambulatory Visit: Admitting: Podiatry

## 2024-10-03 ENCOUNTER — Ambulatory Visit

## 2024-10-03 VITALS — Ht 70.0 in | Wt 250.0 lb

## 2024-10-03 DIAGNOSIS — S92355D Nondisplaced fracture of fifth metatarsal bone, left foot, subsequent encounter for fracture with routine healing: Secondary | ICD-10-CM

## 2024-10-07 NOTE — Progress Notes (Signed)
°  Subjective:  Patient ID: Kristin Nguyen, female    DOB: [birth year],  MRN: [id number]  Chief Complaint  Patient presents with   Post-op Follow-up    Rm 1 Return in about 6 weeks (around 09/28/2024) for post op (new x-rays).      63 y.o. female returns for post-op check.  Doing well pain is controlled  Review of Systems: Negative except as noted in the HPI. Denies N/V/F/Ch.   Objective:  There were no vitals filed for this visit. Body mass index is 35.87 kg/m. Constitutional Well developed. Well nourished.  Vascular Foot warm and well perfused. Capillary refill normal to all digits.  Calf is soft and supple, no posterior calf or knee pain, negative Homans' sign  Neurologic Normal speech. Oriented to person, place, and time. Epicritic sensation to light touch grossly present bilaterally.  Dermatologic Incision well-healed not hypertrophic  Orthopedic: No pain to palpation   Multiple view plain film radiographs: Correction maintained.  Good fracture healing, only small amount of plantar lateral cortex remains to heal  Assessment:   1. Closed nondisplaced fracture of fifth metatarsal bone of left foot with routine healing, subsequent encounter    Plan:  Patient was evaluated and treated and all questions answered.  S/p foot surgery left - Doing very well.  Activity and shoes as tolerated. Discuss avoidance of major impact activity now but low impact exercise OK. Can go w/o bracing. F/u in 3 months for final 6 month POV  No follow-ups on file.
